# Patient Record
Sex: Female | Born: 1935 | Race: Asian | Hispanic: No | Marital: Single | Smoking: Never smoker
Health system: Southern US, Community
[De-identification: ages and names within clinical notes are randomized; demographics above are authoritative.]

## PROBLEM LIST (undated history)

## (undated) DIAGNOSIS — K219 Gastro-esophageal reflux disease without esophagitis: Secondary | ICD-10-CM

## (undated) DIAGNOSIS — Z9109 Other allergy status, other than to drugs and biological substances: Secondary | ICD-10-CM

## (undated) DIAGNOSIS — C189 Malignant neoplasm of colon, unspecified: Secondary | ICD-10-CM

## (undated) DIAGNOSIS — F419 Anxiety disorder, unspecified: Secondary | ICD-10-CM

## (undated) HISTORY — PX: OTHER SURGICAL HISTORY: SHX169

## (undated) HISTORY — DX: Malignant neoplasm of colon, unspecified: C18.9

## (undated) HISTORY — DX: Other allergy status, other than to drugs and biological substances: Z91.09

## (undated) HISTORY — DX: Gastro-esophageal reflux disease without esophagitis: K21.9

## (undated) HISTORY — DX: Anxiety disorder, unspecified: F41.9

---

## 2020-03-24 ENCOUNTER — Encounter: Payer: Self-pay | Admitting: Internal Medicine

## 2020-03-24 ENCOUNTER — Ambulatory Visit (INDEPENDENT_AMBULATORY_CARE_PROVIDER_SITE_OTHER): Payer: Medicare Other | Admitting: Internal Medicine

## 2020-03-24 VITALS — BP 100/68 | HR 85 | Temp 97.9°F | Resp 16 | Ht 59.0 in | Wt 117.2 lb

## 2020-03-24 DIAGNOSIS — R079 Chest pain, unspecified: Secondary | ICD-10-CM

## 2020-03-24 DIAGNOSIS — F3341 Major depressive disorder, recurrent, in partial remission: Secondary | ICD-10-CM

## 2020-03-24 DIAGNOSIS — R42 Dizziness and giddiness: Secondary | ICD-10-CM

## 2020-03-24 DIAGNOSIS — H818X9 Other disorders of vestibular function, unspecified ear: Secondary | ICD-10-CM

## 2020-03-24 DIAGNOSIS — K219 Gastro-esophageal reflux disease without esophagitis: Secondary | ICD-10-CM | POA: Diagnosis not present

## 2020-03-24 DIAGNOSIS — G252 Other specified forms of tremor: Secondary | ICD-10-CM

## 2020-03-24 DIAGNOSIS — D519 Vitamin B12 deficiency anemia, unspecified: Secondary | ICD-10-CM

## 2020-03-24 DIAGNOSIS — R1310 Dysphagia, unspecified: Secondary | ICD-10-CM

## 2020-03-24 MED ORDER — PANTOPRAZOLE SODIUM 40 MG PO TBEC: DELAYED_RELEASE_TABLET | ORAL | 3 refills | Status: AC

## 2020-03-24 NOTE — Progress Notes (Signed)
[facility] [place], [place] [address]  Internal MEDICINE  Office Visit Note  Patient Name: Adrienne Horton  [id number]  [id number]  Date of Service: 04/01/2020   Complaints/HPI Pt is here for establishment of PCP. Chief Complaint  Patient presents with  . New Patient (Initial Visit)  . Gastroesophageal Reflux    heartburn  . Dizziness  . Depression    Pt talking about death per daughter  . Allergic Rhinitis    HPI Pt is here with her daughter for establishment of PCP She just moved from [place], does not speak english. Main concerns are heartburn on Prilosec, she coughs at night and at times chokes. Pt is living with her daughter, has a language barrier. C/o tremors, has been on multiple medications, has been on primidone and  propanolol for it. Her blood pressure is very low, c/o being dizzy all the time Problem sleeping well, takes multiple OTC medications with no relief. H/O colon cancer not sure which year and staging. Daughter thinks she does not listen to her. Feels sad at times and talks about death  She is having more allergies since moving to [facility]   Current Medication: Outpatient Encounter Medications as of 03/24/2020  Medication Sig  . Calcium Carbonate-Vit D-Min (CALCIUM 1200 PO) Take by mouth.  . diphenhydrAMINE HCl (BENADRYL ALLERGY PO) Take by mouth.  . Multiple Vitamin (MULTIVITAMIN) tablet Take 1 tablet by mouth daily.  . Omega-3 Fatty Acids (FISH OIL) 1000 MG CAPS Take by mouth.  . pantoprazole (PROTONIX) 40 MG tablet TAKE ONE TAB A DAY FOR HEARTBURN  . primidone (MYSOLINE) 50 MG tablet Take by mouth at bedtime. (Patient not taking: Reported on 03/30/2020)  . [DISCONTINUED] MELATONIN PO Take by mouth.  . [DISCONTINUED] meloxicam (MOBIC) 15 MG tablet Take 15 mg by mouth daily.  . [DISCONTINUED] omeprazole (PRILOSEC) 40 MG capsule Take 40 mg by mouth daily.  . [DISCONTINUED] propranolol (INDERAL) 40 MG tablet Take 40 mg by mouth 2 (two) times  daily.   No facility-administered encounter medications on file as of 03/24/2020.    Surgical History: Past Surgical History:  Procedure Laterality Date  . colon cancer      Medical History: Past Medical History:  Diagnosis Date  . Anxiety   . Colon cancer ([place])   . Environmental allergies   . GERD (gastroesophageal reflux disease)     Family History: Family History  Family history unknown: Yes    Social History   Socioeconomic History  . Marital status: Single    Spouse name: Not on file  . Number of children: Not on file  . Years of education: Not on file  . Highest education level: Not on file  Occupational History  . Not on file  Tobacco Use  . Smoking status: Never Smoker  . Smokeless tobacco: Never Used  Substance and Sexual Activity  . Alcohol use: Yes    Comment: occational  . Drug use: Never  . Sexual activity: Not on file  Other Topics Concern  . Not on file  Social History Narrative  . Not on file   Social Determinants of Health   Financial Resource Strain: Not on file  Food Insecurity: Not on file  Transportation Needs: Not on file  Physical Activity: Not on file  Stress: Not on file  Social Connections: Not on file  Intimate Partner Violence: Not on file     Review of Systems  Constitutional: Negative for chills, diaphoresis and fatigue.  HENT: Negative for ear pain, postnasal drip and sinus pressure.   Eyes: Negative for photophobia, discharge, redness, itching and visual disturbance.  Respiratory: Positive for choking. Negative for cough, shortness of breath and wheezing.   Cardiovascular: Negative for chest pain, palpitations and leg swelling.  Gastrointestinal: Negative for abdominal pain, constipation, diarrhea, nausea and vomiting.       Heart burn and swallowing problem   Genitourinary: Negative for dysuria and flank pain.  Musculoskeletal: Negative for arthralgias, back pain, gait problem and neck pain.  Skin: Negative for color  change.  Allergic/Immunologic: Negative for environmental allergies and food allergies.  Neurological: Positive for tremors, weakness and light-headedness. Negative for dizziness and headaches.  Hematological: Does not bruise/bleed easily.  Psychiatric/Behavioral: Positive for dysphoric mood. Negative for agitation, behavioral problems (depression) and hallucinations.    Vital Signs: BP 100/68   Pulse 85   Temp 97.9 F (36.6 C)   Resp 16   Ht 4\' 11"  (1.499 m)   Wt 117 lb 3.2 oz (53.2 kg)   SpO2 97%   BMI 23.67 kg/m    Physical Exam Constitutional:      General: She is not in acute distress.    Appearance: She is well-developed. She is not diaphoretic.  HENT:     Head: Normocephalic and atraumatic.     Mouth/Throat:     Pharynx: No oropharyngeal exudate.  Eyes:     Pupils: Pupils are equal, round, and reactive to light.  Neck:     Thyroid: No thyromegaly.     Vascular: No JVD.     Trachea: No tracheal deviation.  Cardiovascular:     Rate and Rhythm: Normal rate and regular rhythm.     Heart sounds: Normal heart sounds. No murmur heard. No friction rub. No gallop.   Pulmonary:     Effort: Pulmonary effort is normal. No respiratory distress.     Breath sounds: No wheezing or rales.  Chest:     Chest wall: No tenderness.  Abdominal:     General: Bowel sounds are normal.     Palpations: Abdomen is soft.  Musculoskeletal:        General: Normal range of motion.     Cervical back: Normal range of motion and neck supple.  Lymphadenopathy:     Cervical: No cervical adenopathy.  Skin:    General: Skin is warm and dry.  Neurological:     General: No focal deficit present.     Mental Status: She is alert.     Cranial Nerves: No cranial nerve deficit.     Motor: Weakness present.     Gait: Gait abnormal.  Psychiatric:        Behavior: Behavior normal.        Thought Content: Thought content normal.        Judgment: Judgment normal.    Assessment/Plan: 1. GERD  without esophagitis Pt takes PPI inly on prn basis, She was instructed to take it qd   - pantoprazole (PROTONIX) 40 MG tablet; TAKE ONE TAB A DAY FOR HEARTBURN  Dispense: 30 tablet; Refill: 3 - CBC with Differential/Platelet - B12 and Folate Panel - Fe+TIBC+Fer  2. Persistent postural-perceptual dizziness Multi factorial, can be due to propranolol, causing iatrogenic hypotension, dc all meds - CBC with Differential/Platelet - TSH - T4, free - Comprehensive metabolic panel - Urinalysis - EKG 12-Lead  3. Recurrent major depressive disorder, in partial remission ([place]) Will continue to monitor, start therapy at next visit - Lipid Panel  With LDL/HDL Ratio - TSH - T4, free  4. Coarse tremors Pt has dx of senile tremors, however getting worse now, no formal evaluation for parkinsonism, might need CT head and evaluation from neurology   - CBC with Differential/Platelet  5. Dysphagia, unspecified type Needs further eval  - DG UGI W DOUBLE CM (HD BA); Future  6. Anemia due to vitamin B12 deficiency, unspecified B12 deficiency type - B12 and Folate Panel - Fe+TIBC+Fer   General Counseling: Annalissa verbalizes understanding of the findings of todays visit and agrees with plan of treatment. I have discussed any further diagnostic evaluation that may be needed or ordered today. We also reviewed her medications today. she has been encouraged to call the office with any questions or concerns that should arise related to todays visit.   Orders Placed This Encounter  Procedures  . DG UGI W DOUBLE CM (HD BA)  . CBC with Differential/Platelet  . Lipid Panel With LDL/HDL Ratio  . TSH  . T4, free  . Comprehensive metabolic panel  . Urinalysis  . B12 and Folate Panel  . Fe+TIBC+Fer  . EKG 12-Lead    Meds ordered this encounter  Medications  . pantoprazole (PROTONIX) 40 MG tablet    Sig: TAKE ONE TAB A DAY FOR HEARTBURN    Dispense:  30 tablet    Refill:  3    Time spent:45  Minutes

## 2020-03-26 LAB — IRON,TIBC AND FERRITIN PANEL
Ferritin: 129 ng/mL (ref 15–150)
Iron Saturation: 21 % (ref 15–55)
Iron: 63 ug/dL (ref 27–139)
Total Iron Binding Capacity: 300 ug/dL (ref 250–450)
UIBC: 237 ug/dL (ref 118–369)

## 2020-03-26 LAB — COMPREHENSIVE METABOLIC PANEL
ALT: 12 IU/L (ref 0–32)
AST: 26 IU/L (ref 0–40)
Albumin/Globulin Ratio: 1.3 (ref 1.2–2.2)
Albumin: 4.3 g/dL (ref 3.6–4.6)
Alkaline Phosphatase: 87 IU/L (ref 44–121)
BUN/Creatinine Ratio: 25 (ref 12–28)
BUN: 34 mg/dL — ABNORMAL HIGH (ref 8–27)
Bilirubin Total: 0.7 mg/dL (ref 0.0–1.2)
CO2: 29 mmol/L (ref 20–29)
Calcium: 10.3 mg/dL (ref 8.7–10.3)
Chloride: 96 mmol/L (ref 96–106)
Creatinine, Ser: 1.37 mg/dL — ABNORMAL HIGH (ref 0.57–1.00)
Globulin, Total: 3.4 g/dL (ref 1.5–4.5)
Glucose: 105 mg/dL — ABNORMAL HIGH (ref 65–99)
Potassium: 4.5 mmol/L (ref 3.5–5.2)
Sodium: 139 mmol/L (ref 134–144)
Total Protein: 7.7 g/dL (ref 6.0–8.5)
eGFR: 38 mL/min/{1.73_m2} — ABNORMAL LOW (ref >59)

## 2020-03-26 LAB — B12 AND FOLATE PANEL
Folate: 5.4 ng/mL (ref >3.0)
Vitamin B-12: 781 pg/mL (ref 232–1245)

## 2020-03-26 LAB — CBC WITH DIFFERENTIAL/PLATELET
Basophils Absolute: 0 10*3/uL (ref 0.0–0.2)
Basos: 1 %
EOS (ABSOLUTE): 0.2 10*3/uL (ref 0.0–0.4)
Eos: 4 %
Hematocrit: 39.5 % (ref 34.0–46.6)
Hemoglobin: 13.3 g/dL (ref 11.1–15.9)
Immature Grans (Abs): 0 10*3/uL (ref 0.0–0.1)
Immature Granulocytes: 0 %
Lymphocytes Absolute: 1.8 10*3/uL (ref 0.7–3.1)
Lymphs: 40 %
MCH: 33.3 pg — ABNORMAL HIGH (ref 26.6–33.0)
MCHC: 33.7 g/dL (ref 31.5–35.7)
MCV: 99 fL — ABNORMAL HIGH (ref 79–97)
Monocytes Absolute: 0.5 10*3/uL (ref 0.1–0.9)
Monocytes: 12 %
Neutrophils Absolute: 1.9 10*3/uL (ref 1.4–7.0)
Neutrophils: 43 %
Platelets: 175 10*3/uL (ref 150–450)
RBC: 4 x10E6/uL (ref 3.77–5.28)
RDW: 11.9 % (ref 11.7–15.4)
WBC: 4.4 10*3/uL (ref 3.4–10.8)

## 2020-03-26 LAB — LIPID PANEL WITH LDL/HDL RATIO
Cholesterol, Total: 189 mg/dL (ref 100–199)
HDL: 56 mg/dL (ref >39)
LDL Chol Calc (NIH): 119 mg/dL — ABNORMAL HIGH (ref 0–99)
LDL/HDL Ratio: 2.1 ratio (ref 0.0–3.2)
Triglycerides: 79 mg/dL (ref 0–149)
VLDL Cholesterol Cal: 14 mg/dL (ref 5–40)

## 2020-03-26 LAB — T4, FREE: Free T4: 1.2 ng/dL (ref 0.82–1.77)

## 2020-03-26 LAB — TSH: TSH: 4 u[IU]/mL (ref 0.450–4.500)

## 2020-03-26 NOTE — Progress Notes (Signed)
Reviewed and will be discussed on next follow up

## 2020-03-30 ENCOUNTER — Ambulatory Visit (INDEPENDENT_AMBULATORY_CARE_PROVIDER_SITE_OTHER): Payer: Medicare Other | Admitting: Physician Assistant

## 2020-03-30 ENCOUNTER — Encounter: Payer: Self-pay | Admitting: Physician Assistant

## 2020-03-30 ENCOUNTER — Other Ambulatory Visit: Payer: Self-pay

## 2020-03-30 DIAGNOSIS — N289 Disorder of kidney and ureter, unspecified: Secondary | ICD-10-CM

## 2020-03-30 DIAGNOSIS — K219 Gastro-esophageal reflux disease without esophagitis: Secondary | ICD-10-CM | POA: Diagnosis not present

## 2020-03-30 DIAGNOSIS — R42 Dizziness and giddiness: Secondary | ICD-10-CM | POA: Diagnosis not present

## 2020-03-30 DIAGNOSIS — F3341 Major depressive disorder, recurrent, in partial remission: Secondary | ICD-10-CM

## 2020-03-30 DIAGNOSIS — G252 Other specified forms of tremor: Secondary | ICD-10-CM

## 2020-03-30 MED ORDER — MIRTAZAPINE 7.5 MG PO TABS: 7.5000 mg | ORAL_TABLET | Freq: Every day | ORAL | 2 refills | Status: AC

## 2020-03-30 NOTE — Progress Notes (Signed)
[facility] [place], [place] [address]  Internal MEDICINE  Office Visit Note  Patient Name: Adrienne Horton  [id number]  [id number]  Date of Service: 03/31/2020  Chief Complaint  Patient presents with  . Follow-up    Review labs, discuss meds, when pt wakes up she feels dizzy, dry eyes  . Gastroesophageal Reflux  . Anxiety  . Quality Metric Gaps    Took all covid vaccine but doesn't have dates, dexa, flu shot was done     HPI Pt is here for f/u to review labs and monitor BP. She is here with her daughter who helps with translation. -GERD is much better since starting pantoprazole. She stopped taking it a few days due to one day of constipation. Reiterated that she needs to keep taking med and try to increase fiber and maybe take metamucil if constipation continues. -No longer taking propranolol or primidone. She does take a small white pill at night but doesn't know the name of it -She still does not sleep well -Feels like when she urinates not all is coming out. This has been for 2 years now. Denies incontinence or dysuria.  Current Medication: Outpatient Encounter Medications as of 03/30/2020  Medication Sig  . Calcium Carbonate-Vit D-Min (CALCIUM 1200 PO) Take by mouth.  . diphenhydrAMINE HCl (BENADRYL ALLERGY PO) Take by mouth.  . mirtazapine (REMERON) 7.5 MG tablet Take 1 tablet (7.5 mg total) by mouth at bedtime.  . Multiple Vitamin (MULTIVITAMIN) tablet Take 1 tablet by mouth daily.  . Omega-3 Fatty Acids (FISH OIL) 1000 MG CAPS Take by mouth.  . pantoprazole (PROTONIX) 40 MG tablet TAKE ONE TAB A DAY FOR HEARTBURN  . primidone (MYSOLINE) 50 MG tablet Take by mouth at bedtime. (Patient not taking: Reported on 03/30/2020)   No facility-administered encounter medications on file as of 03/30/2020.    Surgical History: Past Surgical History:  Procedure Laterality Date  . colon cancer      Medical History: Past Medical History:  Diagnosis Date  .  Anxiety   . Colon cancer ([place])   . Environmental allergies   . GERD (gastroesophageal reflux disease)     Family History: Family History  Family history unknown: Yes    Social History   Socioeconomic History  . Marital status: Single    Spouse name: Not on file  . Number of children: Not on file  . Years of education: Not on file  . Highest education level: Not on file  Occupational History  . Not on file  Tobacco Use  . Smoking status: Never Smoker  . Smokeless tobacco: Never Used  Substance and Sexual Activity  . Alcohol use: Yes    Comment: occational  . Drug use: Never  . Sexual activity: Not on file  Other Topics Concern  . Not on file  Social History Narrative  . Not on file   Social Determinants of Health   Financial Resource Strain: Not on file  Food Insecurity: Not on file  Transportation Needs: Not on file  Physical Activity: Not on file  Stress: Not on file  Social Connections: Not on file  Intimate Partner Violence: Not on file      Review of Systems  Constitutional: Negative for chills, fatigue and unexpected weight change.  HENT: Negative for congestion, postnasal drip, rhinorrhea, sneezing and sore throat.   Eyes: Negative for redness.  Respiratory: Negative for cough, chest tightness and shortness of breath.   Cardiovascular: Negative for chest  pain and palpitations.  Gastrointestinal: Positive for constipation. Negative for abdominal pain, diarrhea, nausea and vomiting.  Genitourinary: Negative for dysuria and frequency.  Musculoskeletal: Negative for arthralgias, back pain, joint swelling and neck pain.  Skin: Negative for rash.  Neurological: Positive for dizziness. Negative for tremors and numbness.  Hematological: Negative for adenopathy. Does not bruise/bleed easily.  Psychiatric/Behavioral: Positive for sleep disturbance. Negative for behavioral problems (Depression) and suicidal ideas. The patient is nervous/anxious.     Vital  Signs: BP (!) 114/56   Pulse 70   Temp 97.9 F (36.6 C)   Resp 16   Ht 4\' 11"  (1.499 m)   Wt 117 lb 6.4 oz (53.3 kg)   SpO2 98%   BMI 23.71 kg/m    Physical Exam Constitutional:      General: She is not in acute distress.    Appearance: She is well-developed and normal weight. She is not diaphoretic.  HENT:     Head: Normocephalic and atraumatic.     Mouth/Throat:     Pharynx: No oropharyngeal exudate.  Eyes:     Pupils: Pupils are equal, round, and reactive to light.  Neck:     Thyroid: No thyromegaly.     Vascular: No JVD.     Trachea: No tracheal deviation.  Cardiovascular:     Rate and Rhythm: Normal rate and regular rhythm.     Heart sounds: Normal heart sounds. No murmur heard. No friction rub. No gallop.   Pulmonary:     Effort: Pulmonary effort is normal. No respiratory distress.     Breath sounds: No wheezing or rales.  Chest:     Chest wall: No tenderness.  Abdominal:     General: Bowel sounds are normal.     Palpations: Abdomen is soft.  Musculoskeletal:        General: Normal range of motion.     Cervical back: Normal range of motion and neck supple.  Lymphadenopathy:     Cervical: No cervical adenopathy.  Skin:    General: Skin is warm and dry.  Neurological:     Mental Status: She is alert and oriented to person, place, and time.     Cranial Nerves: No cranial nerve deficit.  Psychiatric:        Behavior: Behavior normal.        Thought Content: Thought content normal.        Judgment: Judgment normal.        Assessment/Plan: 1. Persistent postural-perceptual dizziness Orthostatic vitals done in office. Pt appears to be hypotensive likely secondary to hypovolemia. Educated that she needs to increase her fluid intake, both water and gatorade.  2. Abnormal renal function Likely secondary to hypovolemia. Educated in increase fluid intake. Will need to monitor renal function again after next visit.  3. GERD without esophagitis Improving on  oantoprazole  4. Recurrent major depressive disorder, in partial remission ([place]) Pt has difficulty sleeping as well so will start on Mirtazapine daily at bedtime. - mirtazapine (REMERON) 7.5 MG tablet; Take 1 tablet (7.5 mg total) by mouth at bedtime.  Dispense: 30 tablet; Refill: 2  5. Coarse tremors D/c propranolol secondary to dizziness and low BP.    General Counseling: Norely verbalizes understanding of the findings of todays visit and agrees with plan of treatment. I have discussed any further diagnostic evaluation that may be needed or ordered today. We also reviewed her medications today. she has been encouraged to call the office with any questions or concerns that  should arise related to todays visit.    No orders of the defined types were placed in this encounter.   Meds ordered this encounter  Medications  . mirtazapine (REMERON) 7.5 MG tablet    Sig: Take 1 tablet (7.5 mg total) by mouth at bedtime.    Dispense:  30 tablet    Refill:  2    This patient was seen by Drema Dallas, PA-C in collaboration with Dr. Clayborn Bigness as a part of collaborative care agreement.   Total time spent:30 Minutes Time spent includes review of chart, medications, test results, and follow up plan with the patient.      Dr Lavera Guise Internal medicine

## 2020-04-01 ENCOUNTER — Other Ambulatory Visit: Payer: Self-pay

## 2020-04-01 ENCOUNTER — Ambulatory Visit
Admission: RE | Admit: 2020-04-01 | Discharge: 2020-04-01 | Disposition: A | Discharge status: Home / Self Care | Payer: Medicare Other | Source: Ambulatory Visit | Attending: Internal Medicine | Admitting: Internal Medicine

## 2020-04-01 ENCOUNTER — Telehealth: Payer: Self-pay

## 2020-04-01 DIAGNOSIS — R1310 Dysphagia, unspecified: Secondary | ICD-10-CM | POA: Insufficient documentation

## 2020-04-01 DIAGNOSIS — K219 Gastro-esophageal reflux disease without esophagitis: Secondary | ICD-10-CM | POA: Insufficient documentation

## 2020-04-02 NOTE — Telephone Encounter (Signed)
Dr Humphrey Rolls spoke with pt daughter

## 2020-04-10 ENCOUNTER — Telehealth: Payer: Self-pay

## 2020-04-10 NOTE — Telephone Encounter (Signed)
LMOM to stop taking the medication that is making her dizzy and to stay hydrated, also let pt know she can discuss meds at her appt next week

## 2020-04-10 NOTE — Telephone Encounter (Signed)
Ok well tell her to hold it then and to make sure she is staying well hydrated because she was dizzy prior to starting the medication. She has an appt next week

## 2020-04-14 ENCOUNTER — Ambulatory Visit: Payer: Medicare Other | Admitting: Physician Assistant

## 2020-04-20 ENCOUNTER — Ambulatory Visit (INDEPENDENT_AMBULATORY_CARE_PROVIDER_SITE_OTHER): Payer: Medicare Other | Admitting: Physician Assistant

## 2020-04-20 ENCOUNTER — Encounter: Payer: Self-pay | Admitting: Physician Assistant

## 2020-04-20 ENCOUNTER — Other Ambulatory Visit: Payer: Self-pay

## 2020-04-20 DIAGNOSIS — G252 Other specified forms of tremor: Secondary | ICD-10-CM | POA: Diagnosis not present

## 2020-04-20 DIAGNOSIS — R933 Abnormal findings on diagnostic imaging of other parts of digestive tract: Secondary | ICD-10-CM

## 2020-04-20 DIAGNOSIS — N289 Disorder of kidney and ureter, unspecified: Secondary | ICD-10-CM | POA: Diagnosis not present

## 2020-04-20 DIAGNOSIS — R42 Dizziness and giddiness: Secondary | ICD-10-CM

## 2020-04-20 DIAGNOSIS — F411 Generalized anxiety disorder: Secondary | ICD-10-CM | POA: Diagnosis not present

## 2020-04-20 MED ORDER — CLONAZEPAM 0.5 MG PO TABS: 0.2500 mg | ORAL_TABLET | Freq: Three times a day (TID) | ORAL | 3 refills | Status: DC | PRN

## 2020-04-20 NOTE — Progress Notes (Signed)
[facility] [place], [place] [address]  Internal MEDICINE  Office Visit Note  Patient Name: Adrienne Horton  [id number]  [id number]  Date of Service: 04/22/2020  Chief Complaint  Patient presents with  . Anxiety  . Gastroesophageal Reflux    HPI Pt is here for f/u with her daughter who assists with translation. -She stopped the mirtazapine because she thought it made her dizziness worse. -Thinks she needs something for anxiety bc it makes her tremors worse -Wants to go back to old meds--primidone and propranolol? However these may be contributing to dizziness and worsening BP. -Discussed abnormal upper GI study and need for GI referral, however patient is very much against this and declines the referral at this time. Asked that she think about it and will readdress next visit after her anxiety has been better controlled. -Also discussed that she needs to continue to increase fluid intake and that we need to recheck her CMP   Current Medication: Outpatient Encounter Medications as of 04/20/2020  Medication Sig  . Calcium Carbonate-Vit D-Min (CALCIUM 1200 PO) Take by mouth.  . clonazePAM (KLONOPIN) 0.5 MG tablet Take 0.5 tablets (0.25 mg total) by mouth 3 (three) times daily as needed for anxiety. And for tremors  . mirtazapine (REMERON) 7.5 MG tablet Take 1 tablet (7.5 mg total) by mouth at bedtime.  . Multiple Vitamin (MULTIVITAMIN) tablet Take 1 tablet by mouth daily.  . Omega-3 Fatty Acids (FISH OIL) 1000 MG CAPS Take by mouth.  . pantoprazole (PROTONIX) 40 MG tablet TAKE ONE TAB A DAY FOR HEARTBURN  . [DISCONTINUED] diphenhydrAMINE HCl (BENADRYL ALLERGY PO) Take by mouth.  . primidone (MYSOLINE) 50 MG tablet Take by mouth at bedtime. (Patient not taking: Reported on 04/20/2020)   No facility-administered encounter medications on file as of 04/20/2020.    Surgical History: Past Surgical History:  Procedure Laterality Date  . colon cancer      Medical  History: Past Medical History:  Diagnosis Date  . Anxiety   . Colon cancer ([place])   . Environmental allergies   . GERD (gastroesophageal reflux disease)     Family History: Family History  Family history unknown: Yes    Social History   Socioeconomic History  . Marital status: Single    Spouse name: Not on file  . Number of children: Not on file  . Years of education: Not on file  . Highest education level: Not on file  Occupational History  . Not on file  Tobacco Use  . Smoking status: Never Smoker  . Smokeless tobacco: Never Used  Substance and Sexual Activity  . Alcohol use: Yes    Comment: occational  . Drug use: Never  . Sexual activity: Not on file  Other Topics Concern  . Not on file  Social History Narrative  . Not on file   Social Determinants of Health   Financial Resource Strain: Not on file  Food Insecurity: Not on file  Transportation Needs: Not on file  Physical Activity: Not on file  Stress: Not on file  Social Connections: Not on file  Intimate Partner Violence: Not on file      Review of Systems  Constitutional: Negative for chills, fatigue and unexpected weight change.  HENT: Negative for congestion, rhinorrhea, sneezing and sore throat.   Eyes: Negative for redness.  Respiratory: Negative for cough, chest tightness and shortness of breath.   Cardiovascular: Negative for chest pain and palpitations.  Gastrointestinal: Negative for abdominal pain,  constipation, diarrhea, nausea and vomiting.  Genitourinary: Negative for dysuria and frequency.  Musculoskeletal: Negative for arthralgias, back pain, joint swelling and neck pain.  Skin: Negative for rash.  Neurological: Positive for dizziness and tremors. Negative for numbness.  Hematological: Negative for adenopathy. Does not bruise/bleed easily.  Psychiatric/Behavioral: Positive for sleep disturbance. Negative for behavioral problems (Depression) and suicidal ideas. The patient is  nervous/anxious.     Vital Signs: BP 118/60   Pulse 73   Temp 97.9 F (36.6 C)   Resp 16   Ht 4\' 11"  (1.499 m)   Wt 121 lb (54.9 kg)   SpO2 98%   BMI 24.44 kg/m    Physical Exam Vitals and nursing note reviewed.  Constitutional:      General: She is not in acute distress.    Appearance: She is well-developed and normal weight. She is not diaphoretic.  HENT:     Head: Normocephalic and atraumatic.     Mouth/Throat:     Pharynx: No oropharyngeal exudate.  Eyes:     Pupils: Pupils are equal, round, and reactive to light.  Neck:     Thyroid: No thyromegaly.     Vascular: No JVD.     Trachea: No tracheal deviation.  Cardiovascular:     Rate and Rhythm: Normal rate and regular rhythm.     Heart sounds: Normal heart sounds. No murmur heard. No friction rub. No gallop.   Pulmonary:     Effort: Pulmonary effort is normal. No respiratory distress.     Breath sounds: No wheezing or rales.  Chest:     Chest wall: No tenderness.  Abdominal:     General: Bowel sounds are normal.     Palpations: Abdomen is soft.  Musculoskeletal:        General: Normal range of motion.     Cervical back: Normal range of motion and neck supple.  Lymphadenopathy:     Cervical: No cervical adenopathy.  Skin:    General: Skin is warm and dry.  Neurological:     Mental Status: She is alert and oriented to person, place, and time.     Cranial Nerves: No cranial nerve deficit.     Comments: Tremor present  Psychiatric:        Behavior: Behavior normal.        Thought Content: Thought content normal.        Judgment: Judgment normal.        Assessment/Plan: 1. Persistent postural-perceptual dizziness Educated pt on importance of staying well hydrated since BP is always low and may be causing dizziness.  2. GAD (generalized anxiety disorder) Pt may take 1/2 tab up to 3x per day to help with tremor and anxiety. - clonazePAM (KLONOPIN) 0.5 MG tablet; Take 0.5 tablets (0.25 mg total) by  mouth 3 (three) times daily as needed for anxiety. And for tremors  Dispense: 45 tablet; Refill: 3  3. Coarse tremors Pt may take 1/2 tab up  To 3x per day to help with tremor and anxiety. - clonazePAM (KLONOPIN) 0.5 MG tablet; Take 0.5 tablets (0.25 mg total) by mouth 3 (three) times daily as needed for anxiety. And for tremors  Dispense: 45 tablet; Refill: 3  4. Abnormal renal function Repeat CMP, again educated on importance of staying well hydrated. - Comprehensive metabolic panel  5. Abnormal finding on GI tract imaging Discussed that pt needs referral to GI based on findings from upper GI study--however pt refused. Discussed reasoning behind referral and asked  that pt reconsider and we will discuss again next visit. Upper GI showed: -Two small rightward directed likely pulsion diverticula at the level of mid esophagus. -Inconsistent mild esophageal dysmotility. -No evidence of spontaneous gastroesophageal reflux during study. No hiatal hernia. -Minimal silent aspiration.  Speech therapy evaluation is suggested.   General Counseling: Tiawanna verbalizes understanding of the findings of todays visit and agrees with plan of treatment. I have discussed any further diagnostic evaluation that may be needed or ordered today. We also reviewed her medications today. she has been encouraged to call the office with any questions or concerns that should arise related to todays visit.    Orders Placed This Encounter  Procedures  . Comprehensive metabolic panel    Meds ordered this encounter  Medications  . clonazePAM (KLONOPIN) 0.5 MG tablet    Sig: Take 0.5 tablets (0.25 mg total) by mouth 3 (three) times daily as needed for anxiety. And for tremors    Dispense:  45 tablet    Refill:  3    This patient was seen by Drema Dallas, PA-C in collaboration with Dr. Clayborn Bigness as a part of collaborative care agreement.   Total time spent:30 Minutes Time spent includes review of chart,  medications, test results, and follow up plan with the patient.      Dr Lavera Guise Internal medicine

## 2020-04-24 ENCOUNTER — Ambulatory Visit: Payer: Medicare Other | Admitting: Physician Assistant

## 2020-08-20 ENCOUNTER — Ambulatory Visit: Payer: Medicare Other | Admitting: Physician Assistant

## 2020-10-19 ENCOUNTER — Other Ambulatory Visit: Payer: Self-pay

## 2020-10-19 ENCOUNTER — Other Ambulatory Visit: Payer: Self-pay | Admitting: Internal Medicine

## 2020-10-19 ENCOUNTER — Telehealth: Payer: Self-pay

## 2020-10-19 DIAGNOSIS — F411 Generalized anxiety disorder: Secondary | ICD-10-CM

## 2020-10-19 DIAGNOSIS — G252 Other specified forms of tremor: Secondary | ICD-10-CM

## 2020-10-19 MED ORDER — CLONAZEPAM 0.5 MG PO TABS: 0.2500 mg | ORAL_TABLET | Freq: Three times a day (TID) | ORAL | 3 refills | Status: AC | PRN

## 2020-10-19 NOTE — Telephone Encounter (Signed)
done

## 2020-10-19 NOTE — Telephone Encounter (Signed)
Pt  daughter advised we send med  

## 2022-04-20 IMAGING — RF DG UGI W/ HIGH DENSITY W/O KUB
9 of 10 series · 14 of 16 positions shown · non-contrast
Comparison: None.

CLINICAL DATA: Heartburn

EXAM:
UPPER GI SERIES WITHOUT KUB
TECHNIQUE: Routine upper GI series was performed with thin/high density/water
soluble barium.
FLUOROSCOPY TIME:  Fluoroscopy Time:  48 seconds
Radiation Exposure Index (if provided by the fluoroscopic device):
3.4 mGy

[Series 1: cp_standard · 0.26mm/px · 3 of 113 frames shown (1 of 9)]
[frame 5/113]
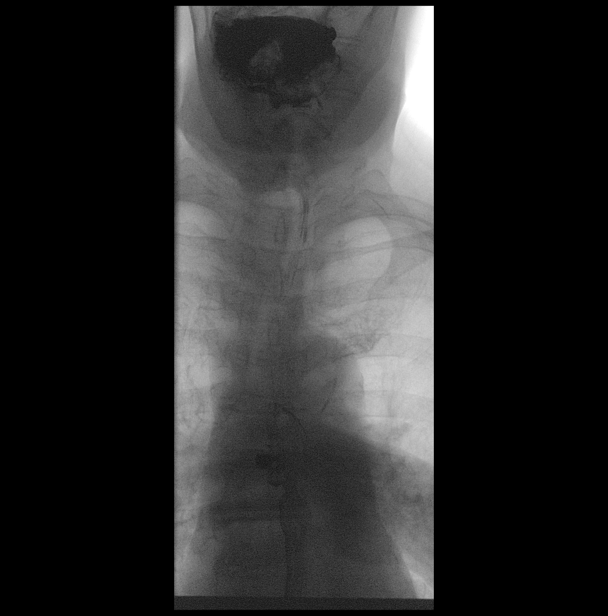
[frame 17/113]
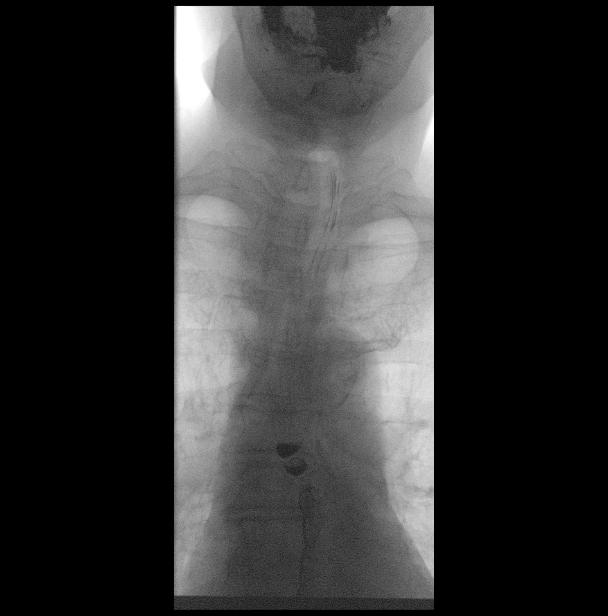
[frame 57/113]
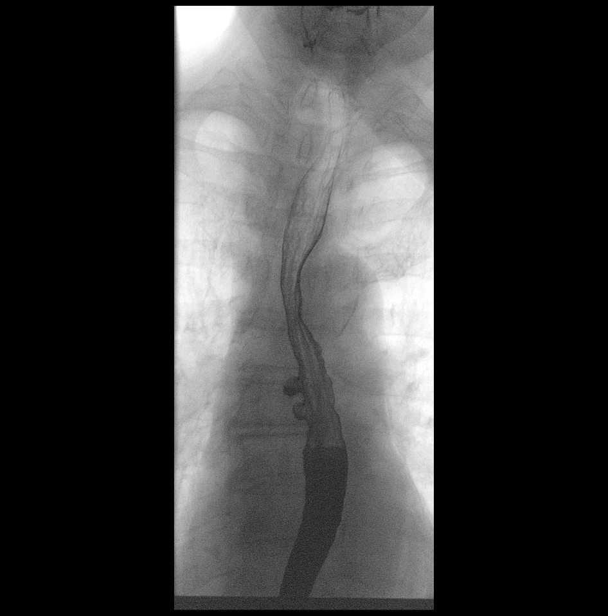

[Series 2: cp_standard · 0.26mm/px · 4 of 99 frames shown (2 of 9)]
[frame 15/99]
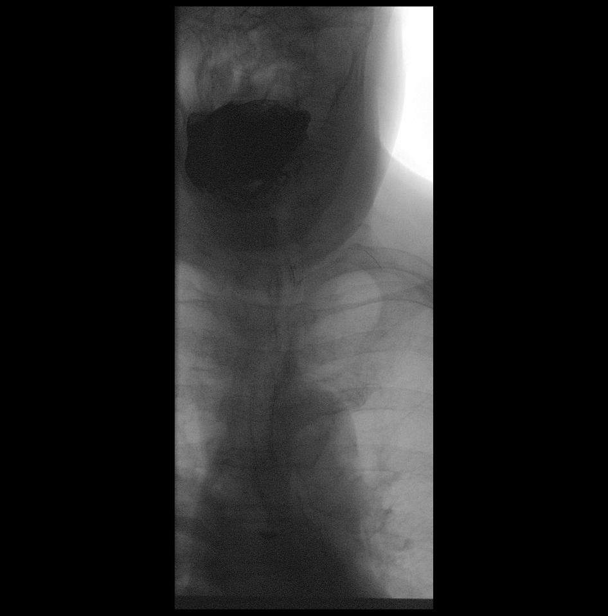
[frame 18/99]
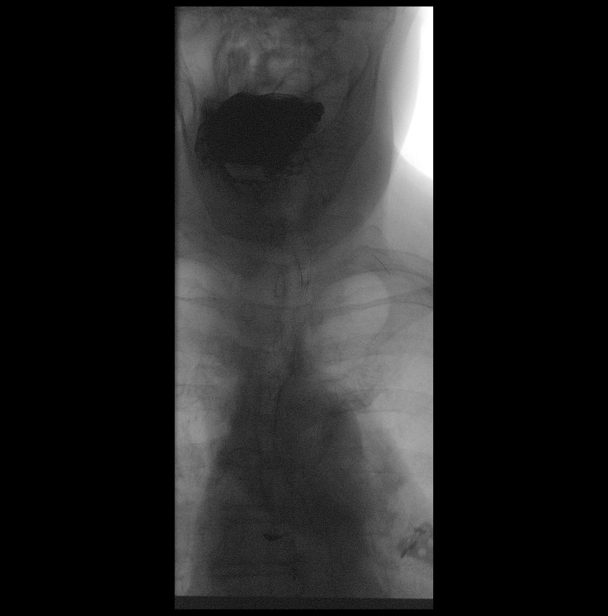
[frame 50/99]
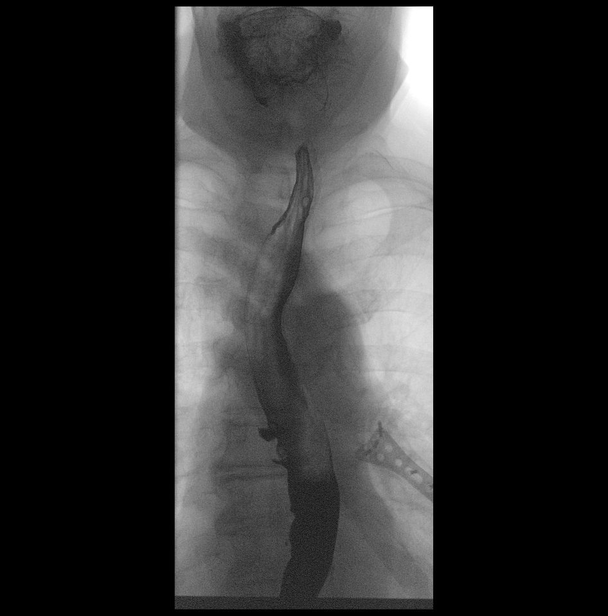
[frame 85/99]
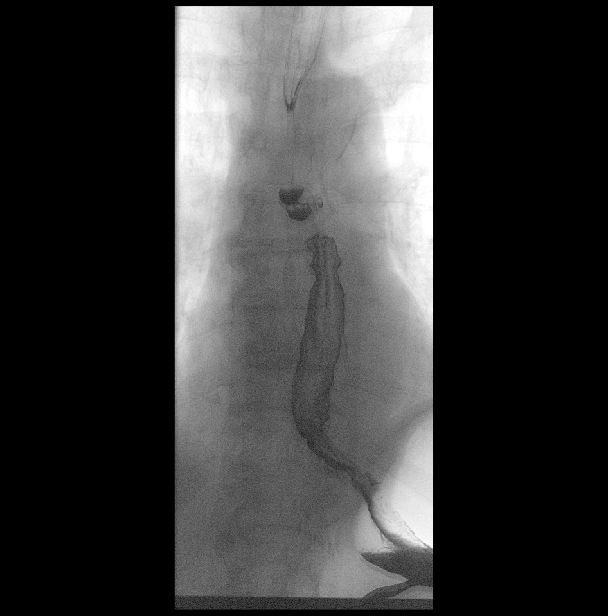

[Series 3: cp_standard · 0.26mm/px · 1 of 1 slices shown (3 of 9)]
[im 1/1]
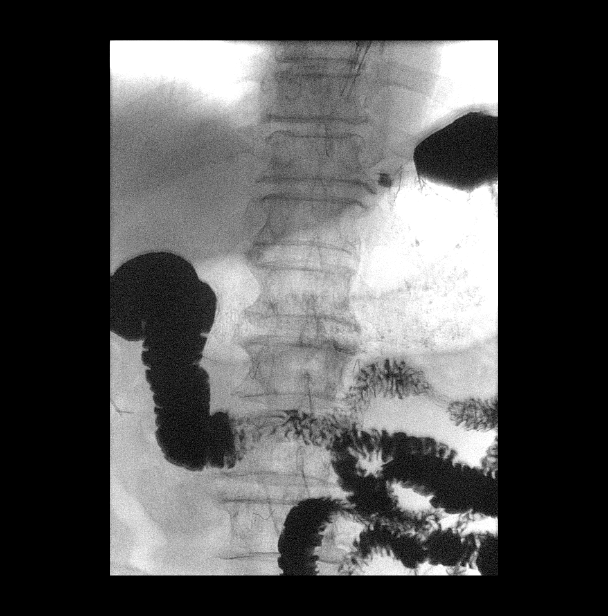

[Series 4: cp_standard · 0.26mm/px · 1 of 1 slices shown (4 of 9)]
[im 1/1]
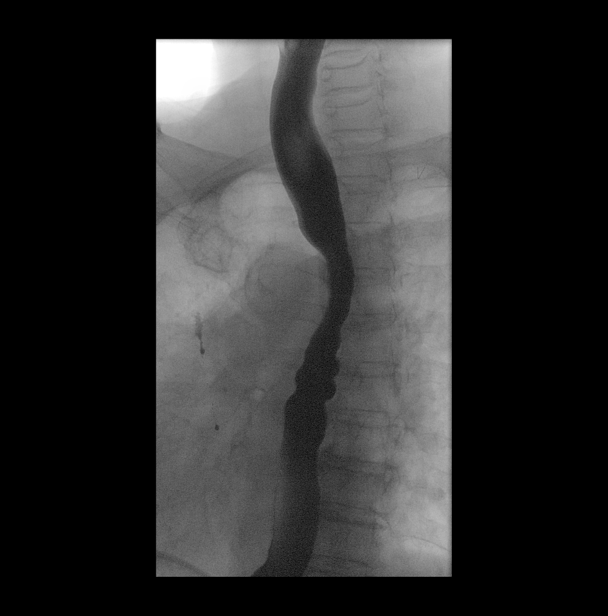

[Series 5: cp_standard · 0.26mm/px · 1 of 1 slices shown (5 of 9)]
[im 1/1]
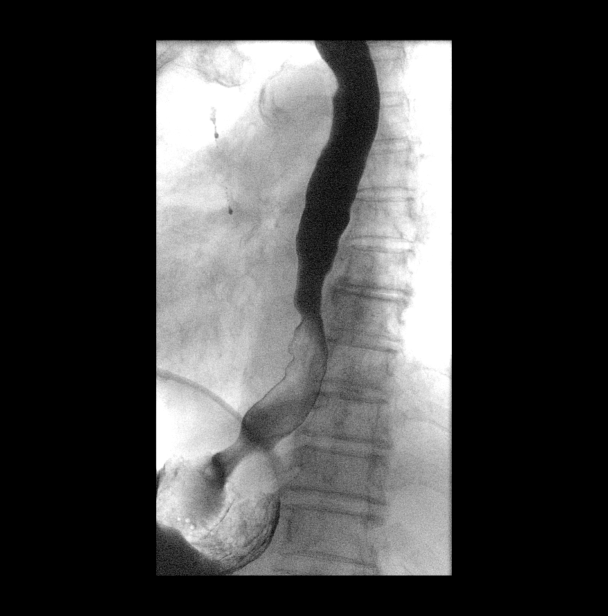

[Series 7: cp_standard · 0.27mm/px · 1 of 1 slices shown (6 of 9)]
[im 1/1]
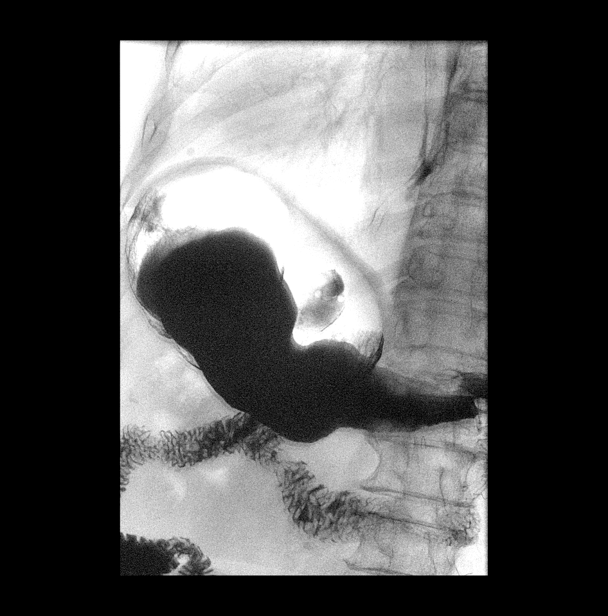

[Series 8: cp_standard · 0.27mm/px · 1 of 1 slices shown (7 of 9)]
[im 1/1]
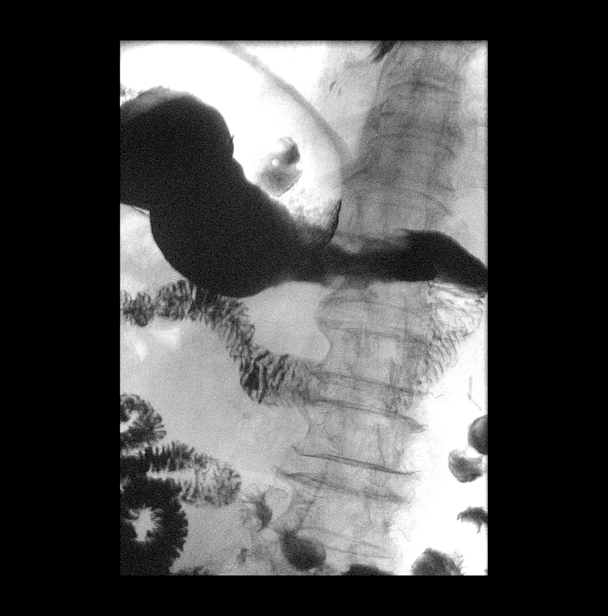

[Series 9: cp_standard · 0.27mm/px · 1 of 1 slices shown (8 of 9)]
[im 1/1]
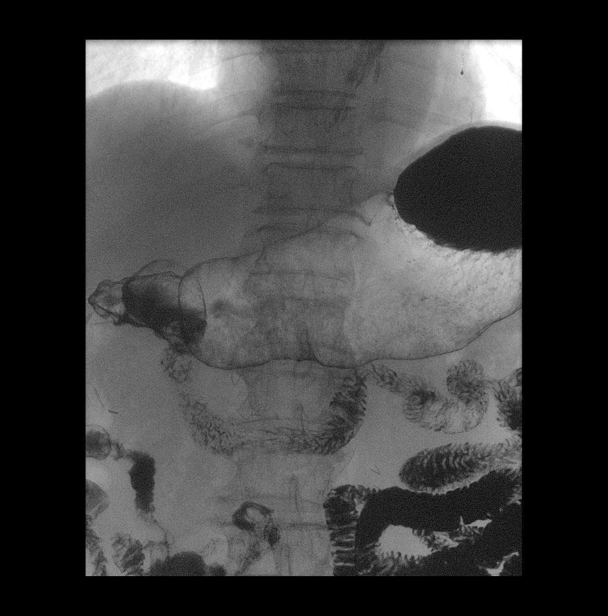

[Series 10: cp_standard · 0.27mm/px · 1 of 1 slices shown (9 of 9)]
[im 1/1]
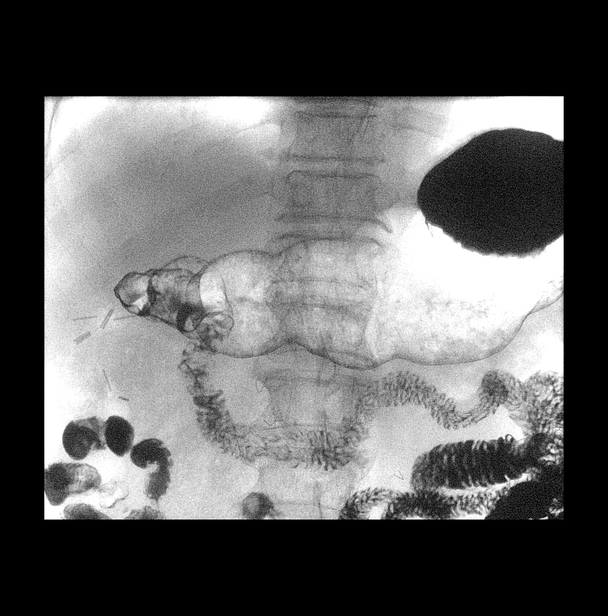

[14 of 16 positions shown; findings below may reference images not displayed]

FINDINGS: Somewhat technically limited due to need for transition by daughter.
Patient swallowed barium without difficulty. There is no esophageal
mass or stricture. Mild dysmotility is inconsistent. There are two
small rightward directed diverticula at the level of the mid
esophagus. No hiatal hernia. No evidence of spontaneous
gastroesophageal reflux. Stomach and duodenum are unremarkable.

Minimal silent aspiration noted.
IMPRESSION: Two small rightward directed likely pulsion diverticula at the level
of mid esophagus.

Inconsistent mild esophageal dysmotility.

No evidence of spontaneous gastroesophageal reflux during study. No
hiatal hernia.

Minimal silent aspiration.  Speech therapy evaluation is suggested.
